# Patient Record
Sex: Male | Born: 1945 | Hispanic: Yes | Marital: Single
Health system: Southern US, Community
[De-identification: ages and names within clinical notes are randomized; demographics above are authoritative.]

---

## 2018-07-16 ENCOUNTER — Other Ambulatory Visit: Payer: Self-pay

## 2018-07-16 ENCOUNTER — Encounter: Payer: Self-pay | Admitting: Emergency Medicine

## 2018-07-16 ENCOUNTER — Emergency Department: Payer: Self-pay

## 2018-07-16 ENCOUNTER — Emergency Department
Admission: EM | Admit: 2018-07-16 | Discharge: 2018-07-17 | Disposition: A | Discharge status: Home / Self Care | Payer: Self-pay | Attending: Student in an Organized Health Care Education/Training Program | Admitting: Student in an Organized Health Care Education/Training Program

## 2018-07-16 DIAGNOSIS — J111 Influenza due to unidentified influenza virus with other respiratory manifestations: Secondary | ICD-10-CM | POA: Insufficient documentation

## 2018-07-16 DIAGNOSIS — K7689 Other specified diseases of liver: Secondary | ICD-10-CM | POA: Insufficient documentation

## 2018-07-16 LAB — CBC WITH DIFFERENTIAL/PLATELET
Abs Immature Granulocytes: 0.03 10*3/uL (ref 0.00–0.07)
Basophils Absolute: 0 10*3/uL (ref 0.0–0.1)
Basophils Relative: 0 %
Eosinophils Absolute: 0 10*3/uL (ref 0.0–0.5)
Eosinophils Relative: 0 %
HCT: 39 % (ref 39.0–52.0)
Hemoglobin: 12.6 g/dL — ABNORMAL LOW (ref 13.0–17.0)
Immature Granulocytes: 0 %
Lymphocytes Relative: 15 %
Lymphs Abs: 1.3 10*3/uL (ref 0.7–4.0)
MCH: 29.2 pg (ref 26.0–34.0)
MCHC: 32.3 g/dL (ref 30.0–36.0)
MCV: 90.5 fL (ref 80.0–100.0)
MONOS PCT: 11 %
Monocytes Absolute: 1 10*3/uL (ref 0.1–1.0)
Neutro Abs: 6.3 10*3/uL (ref 1.7–7.7)
Neutrophils Relative %: 74 %
Platelets: 165 10*3/uL (ref 150–400)
RBC: 4.31 MIL/uL (ref 4.22–5.81)
RDW: 12.5 % (ref 11.5–15.5)
WBC: 8.5 10*3/uL (ref 4.0–10.5)
nRBC: 0 % (ref 0.0–0.2)

## 2018-07-16 LAB — BASIC METABOLIC PANEL
Anion gap: 7 (ref 5–15)
BUN: 19 mg/dL (ref 8–23)
CO2: 25 mmol/L (ref 22–32)
Calcium: 8.4 mg/dL — ABNORMAL LOW (ref 8.9–10.3)
Chloride: 100 mmol/L (ref 98–111)
Creatinine, Ser: 0.8 mg/dL (ref 0.61–1.24)
GFR calc Af Amer: >60 mL/min (ref >60)
GFR calc non Af Amer: >60 mL/min (ref >60)
Glucose, Bld: 107 mg/dL — ABNORMAL HIGH (ref 70–99)
Potassium: 3.9 mmol/L (ref 3.5–5.1)
Sodium: 132 mmol/L — ABNORMAL LOW (ref 135–145)

## 2018-07-16 LAB — GROUP A STREP BY PCR: Group A Strep by PCR: NOT DETECTED

## 2018-07-16 LAB — INFLUENZA PANEL BY PCR (TYPE A & B)
INFLBPCR: POSITIVE — AB
Influenza A By PCR: NEGATIVE

## 2018-07-16 MED ORDER — METHYLPREDNISOLONE SODIUM SUCC 125 MG IJ SOLR
125.0000 mg | Freq: Once | INTRAMUSCULAR | Status: AC
  Administered 2018-07-17: 125 mg via INTRAVENOUS
  Filled 2018-07-16: qty 2

## 2018-07-16 MED ORDER — IOHEXOL 300 MG/ML  SOLN
100.0000 mL | Freq: Once | INTRAMUSCULAR | Status: AC | PRN
  Administered 2018-07-16: 100 mL via INTRAVENOUS
  Filled 2018-07-16: qty 100

## 2018-07-16 MED ORDER — IPRATROPIUM-ALBUTEROL 0.5-2.5 (3) MG/3ML IN SOLN
3.0000 mL | Freq: Once | RESPIRATORY_TRACT | Status: AC
  Administered 2018-07-16: 3 mL via RESPIRATORY_TRACT
  Filled 2018-07-16: qty 3

## 2018-07-16 MED ORDER — SODIUM CHLORIDE 0.9 % IV BOLUS
1000.0000 mL | Freq: Once | INTRAVENOUS | Status: AC
  Administered 2018-07-16: 1000 mL via INTRAVENOUS

## 2018-07-16 NOTE — ED Triage Notes (Signed)
Sore throat and body aches x 3 days.  

## 2018-07-16 NOTE — ED Provider Notes (Signed)
[facility] Emergency Department Provider Note ____________________________________________  Time seen: 1640  I have reviewed the triage vital signs and the nursing notes.  HISTORY  History limited by [place] language. Interpreter Markus Daft) present during interview and exam.  Chief Complaint  Sore Throat and Generalized Body Aches  HPI Sean Potts is a 73 y.o. male presents to the ED, accompanied by his friend. According to his friend, the patient lives in his [name], not far from her home. She and her family regularly check in on the patient, taking him meals & snacks, and inviting him into her home at times. She was concerned after she found his sleeping with complaints of weakness, headaches, sore throat, and bodyaches. He has admitted to decreased appetite, cough, and fatigue. He has denied any frank fevers, nausea, vomiting,chest or abdominal pain.He has been taking ibuprofen intermittently. He denies any pertinent medical history. He is a former smoker and drinks alcohol rarely.   History reviewed. No pertinent past medical history.  There are no active problems to display for this patient.  History reviewed. No pertinent surgical history.  Prior to Admission medications   Medication Sig Start Date End Date Taking? Authorizing Provider  predniSONE (DELTASONE) 10 MG tablet Take 1 tablet (10 mg total) by mouth 2 (two) times daily with a meal. 07/17/18   Genecis Veley, Charlesetta Ivory, PA-C    Allergies Patient has no known allergies.  No family history on file.  Social History Social History   Tobacco Use  . Smoking status: Not on file  Substance Use Topics  . Alcohol use: Not on file  . Drug use: Not on file    Review of Systems  Constitutional: Negative for fever. Reports weakness and general bodyaches.  Eyes: Negative for visual changes. ENT: Positive for sore throat. Cardiovascular: Negative for chest pain. Respiratory: Positive for  shortness of breath. Gastrointestinal: Negative for abdominal pain, vomiting and diarrhea. Genitourinary: Negative for dysuria. Musculoskeletal: Negative for back pain. Skin: Negative for rash. Neurological: Negative for headaches, focal weakness or numbness. ____________________________________________  PHYSICAL EXAM:  VITAL SIGNS: ED Triage Vitals  Enc Vitals Group     BP 07/16/18 1521 (!) 119/59     Pulse Rate 07/16/18 1521 87     Resp 07/16/18 1521 18     Temp 07/16/18 1521 98.1 F (36.7 C)     Temp Source 07/16/18 1521 Oral     SpO2 07/16/18 1521 95 %     Weight 07/16/18 1522 132 lb 4.4 oz (60 kg)     Height 07/16/18 1522 5\' 7"  (1.702 m)     Head Circumference --      Peak Flow --      Pain Score 07/16/18 1522 8     Pain Loc --      Pain Edu? --      Excl. in GC? --     Constitutional: Alert and oriented. Well appearing and in no distress. Head: Normocephalic and atraumatic. Eyes: Conjunctivae are normal. Normal extraocular movements Ears: Canals clear. TMs intact bilaterally. Nose: No congestion/rhinorrhea/epistaxis. Mouth/Throat: Mucous membranes are moist. Poor dentition. No oral lesions. Moderate gingivitis. Neck: Supple. No thyromegaly. Hematological/Lymphatic/Immunological: No cervical lymphadenopathy. Cardiovascular: Normal rate, regular rhythm. Normal distal pulses. Respiratory: Normal respiratory effort. No wheezes/rales. Mild rhonchi noted bilaterally. Gastrointestinal: Soft and nontender. No distention, rebound, guarding, organomegaly or rigidity. Normal bowel sounds noted. Musculoskeletal: Nontender with normal range of motion in all extremities.  Neurologic: Normal speech and language. No gross focal  neurologic deficits are appreciated. Skin:  Skin is warm, dry and intact. No rash noted. ____________________________________________   LABS (pertinent positives/negatives) Labs Reviewed  INFLUENZA PANEL BY PCR (TYPE A & B) - Abnormal; Notable for the  following components:      Result Value   Influenza B By PCR POSITIVE (*)    All other components within normal limits  BASIC METABOLIC PANEL - Abnormal; Notable for the following components:   Sodium 132 (*)    Glucose, Bld 107 (*)    Calcium 8.4 (*)    All other components within normal limits  CBC WITH DIFFERENTIAL/PLATELET - Abnormal; Notable for the following components:   Hemoglobin 12.6 (*)    All other components within normal limits  GROUP A STREP BY PCR  HEPATIC FUNCTION PANEL  ____________________________________________  EKG  NSR 91 bpm Normal axis Normal intervals No STEMI ____________________________________________   RADIOLOGY  CXR  IMPRESSION: No acute disease in the chest. Calcified mass in the liver. CT scan of the abdomen with contrast may be useful for further characterization of the liver mass. No prior studies available for Comparison.  CT ABD w/ CM  IMPRESSION: 4.0 x 3.5 cm rim calcified pericapsular lesion along the posterior right hepatic lobe, likely reflecting sequela of prior infection or trauma, most likely a prior pericapsular hematoma. Regardless, the appearance is almost certainly benign. ____________________________________________  PROCEDURES  Procedures NS 1000 ml IVP DuoNeb x 1 Solumedrol 125 mg IVP ____________________________________________  INITIAL IMPRESSION / ASSESSMENT AND PLAN / ED COURSE  Differential includes, but is not limited to, viral syndrome, bronchitis including COPD exacerbation, pneumonia, reactive airway disease including asthma, CHF including exacerbation with or without pulmonary/interstitial edema, pneumothorax, ACS, thoracic trauma, and pulmonary embolism.  Patient with ED evaluation of several days of fatigue, headache, cough and shortness of breath. He was found to have a PCR screen confirming influenza B. He was also found to have an incidental liver lesion on his otherwise negative CXR. That lesion  was found to be benign after a contrasted abdominal CT scan. His labs were otherwise benign and he was confirming improved appetite and energy at the time of this disposition. He will be discharged with a prescription for prednisone. He may take OTC cough medicine and antipyretics for additional symptom relief. He is beyond the treatment window for any anti-influenza preparations available. Return precautions have been reviewed upon his discharge. The patient and his friend verbalize understanding. ____________________________________________  FINAL CLINICAL IMPRESSION(S) / ED DIAGNOSES  Final diagnoses:  Influenza  Benign liver cyst      Karmen StabsMenshew, Charlesetta IvoryJenise V Bacon, PA-C 07/19/18 1215    Willy Eddyobinson, Patrick, MD 07/24/18 [phone]

## 2018-07-16 NOTE — ED Notes (Signed)
See triage note  Presents with body aches .cough and sore throat times 3 days

## 2018-07-16 NOTE — ED Notes (Signed)
See triage note  Presents with some body aches and sore throat  Afebrile on arrival

## 2018-07-17 LAB — HEPATIC FUNCTION PANEL
ALT: 24 U/L (ref 0–44)
AST: 37 U/L (ref 15–41)
Albumin: 3.5 g/dL (ref 3.5–5.0)
Alkaline Phosphatase: 87 U/L (ref 38–126)
Bilirubin, Direct: <0.1 mg/dL (ref 0.0–0.2)
Total Bilirubin: 0.5 mg/dL (ref 0.3–1.2)
Total Protein: 6.7 g/dL (ref 6.5–8.1)

## 2018-07-17 MED ORDER — PREDNISONE 10 MG PO TABS: 10.0000 mg | ORAL_TABLET | Freq: Two times a day (BID) | ORAL | 0 refills | Status: AC

## 2018-07-17 NOTE — Discharge Instructions (Addendum)
You have tested positive for influenza (flu). Take OTC cough medicine as needed. Take Tylenol and Motrin for fevers and bodyaches. Drink plenty of fluids and rest. Return to the Emergency Department as needed.   Ha dado positivo para la gripe .Marland Kitchenp. Tome medicamentos para la tos de venta libre segn sea necesario. Tome Tylenol y Motrin para fiebres y bodyaches. Beba abundantes lquidos y descanse. Regrese al [employer] segn sea necesario.

## 2020-04-21 IMAGING — CR DG CHEST 2V
1 series · 2 of 2 positions shown · non-contrast
Comparison: None.

CLINICAL DATA: Cough, sore throat, and body aches.

EXAM:
CHEST - 2 VIEW

[Series 1: dg chest 2 view · 0.14mm/px · 2 of 2 slices shown]
[im 1/2]
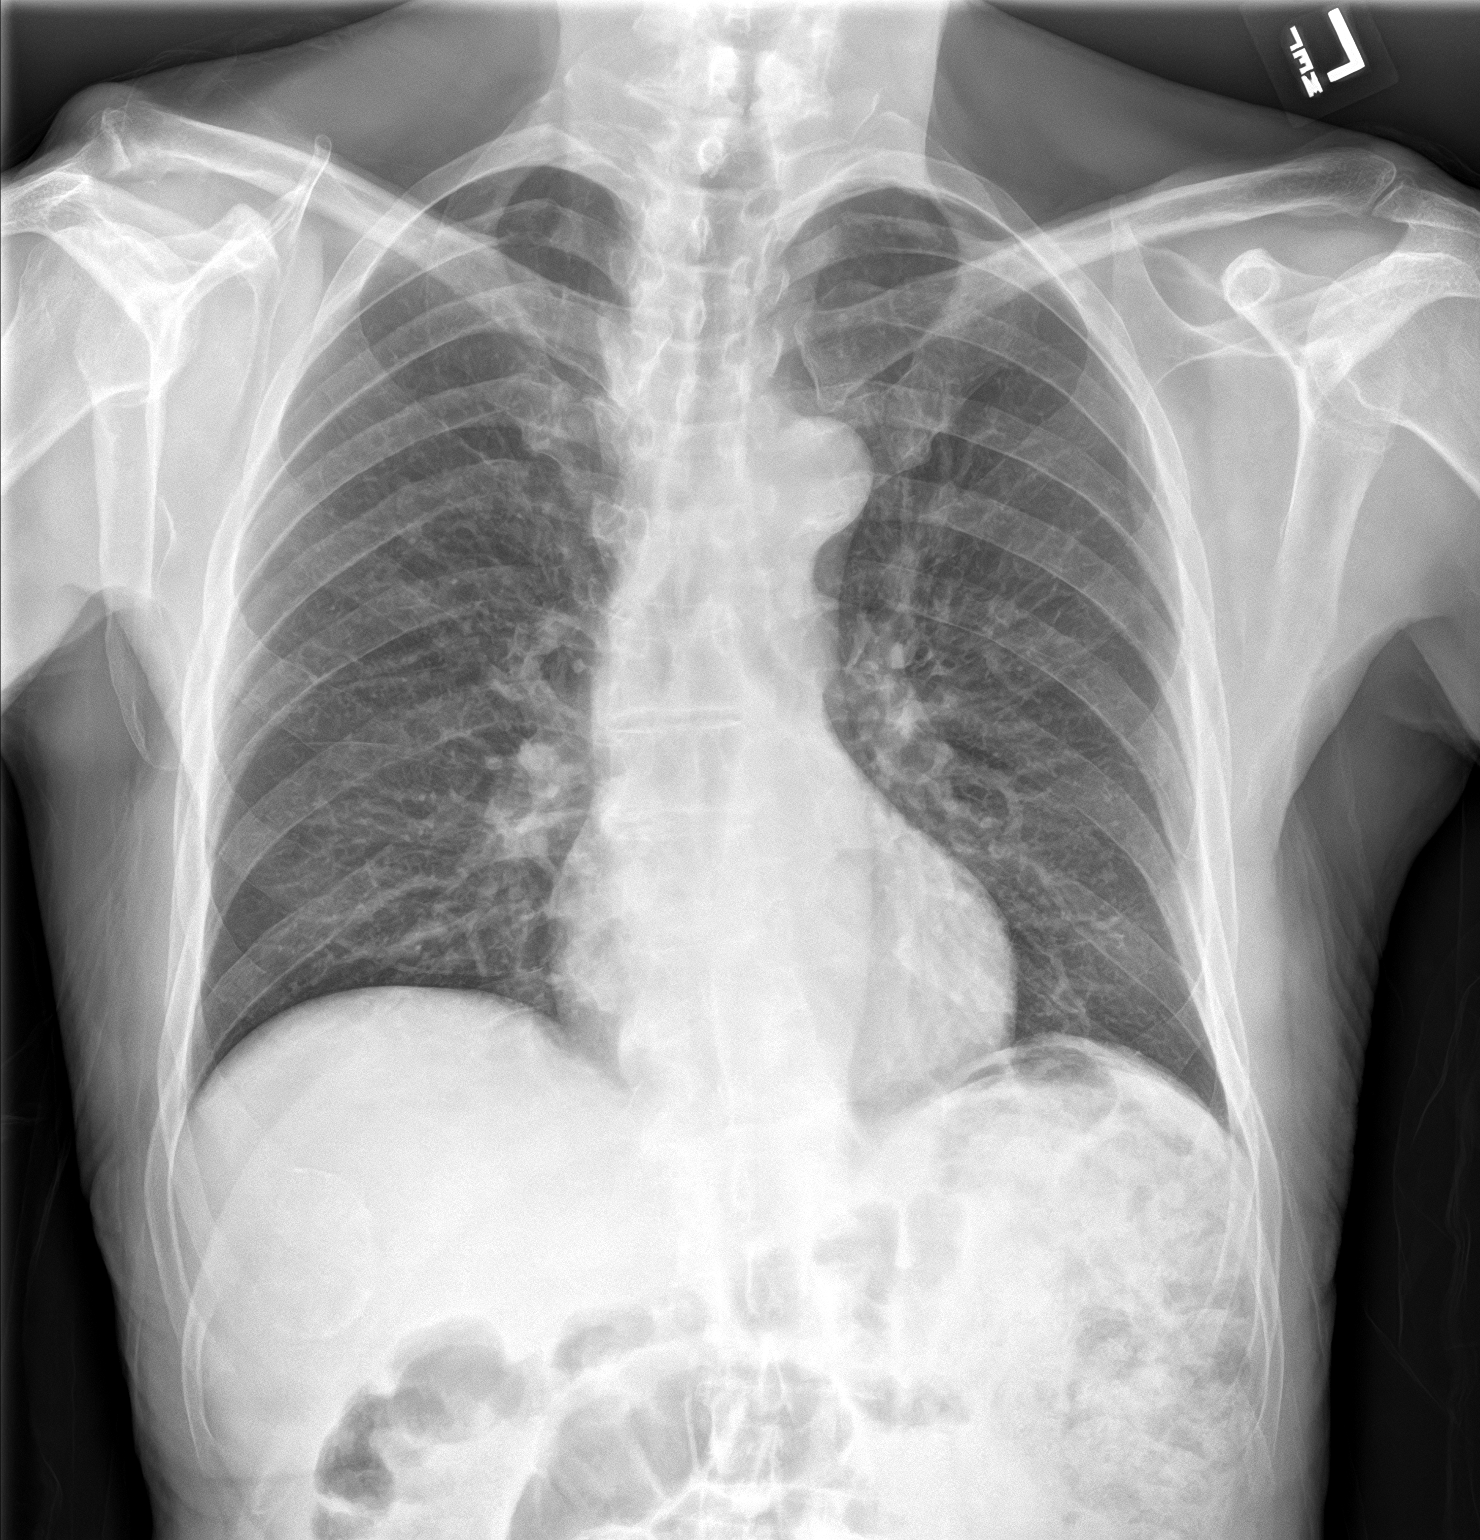
[im 2/2]
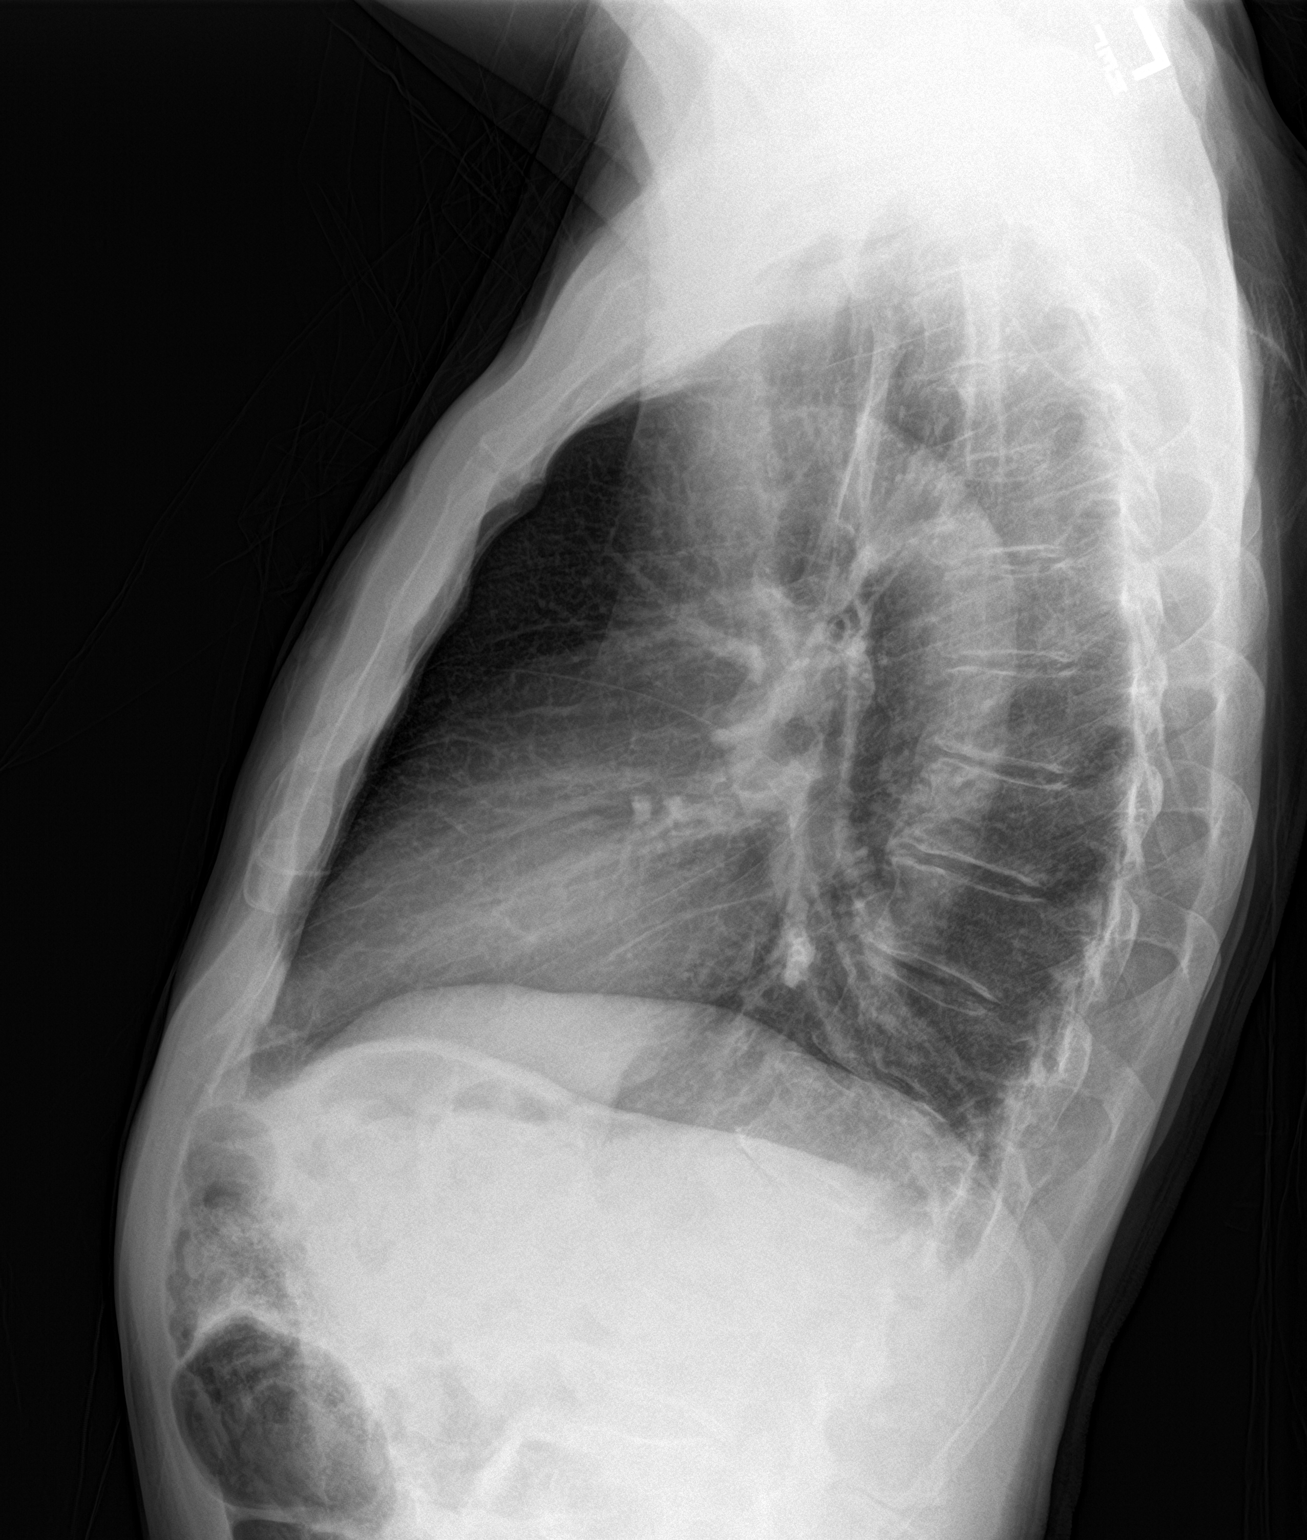

[2 of 2 positions shown; findings below may reference images not displayed]

FINDINGS: The heart size pulmonary vascularity are normal in the lungs are
clear. No effusions. Slight thoracolumbar scoliosis.

Incidental note is made of a 4.7 cm partially calcified mass in the
right upper quadrant, probably in the liver.
IMPRESSION: No acute disease in the chest. Calcified mass in the liver. CT scan
of the abdomen with contrast may be useful for further
characterization of the liver mass. No prior studies available for
comparison.

## 2020-04-21 IMAGING — CT CT ABDOMEN W/ CM
2 of 5 series · 15 of 46 positions shown, 17 images · IV contrast (APPLIED)
Comparison: Chest radiographs dated 07/16/2018

CLINICAL DATA: Sore throat, body aches x3 days, incidental
calcified liver lesion on chest radiograph

EXAM:
CT ABDOMEN WITH CONTRAST
TECHNIQUE: Multidetector CT imaging of the abdomen was performed using the
standard protocol following bolus administration of intravenous
contrast.
CONTRAST:  100mL OMNIPAQUE IOHEXOL 300 MG/ML  SOLN

[Series 2: routine abd/pel with · axial · 0.65mm/px · z∈[-218,+2]mm · 12 of 54 slices shown, 14 images]
[im 5/54  soft-tissue]
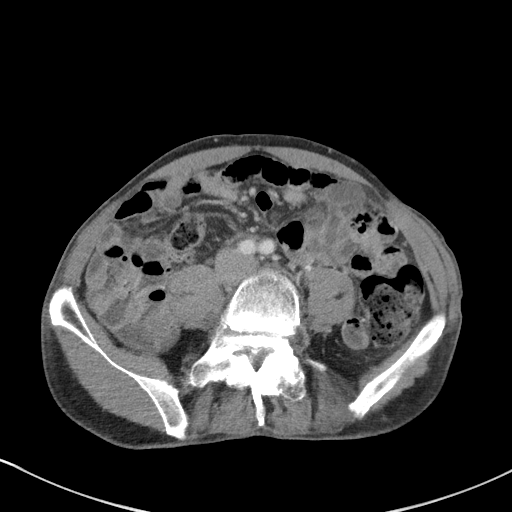
[im 5/54  bone]
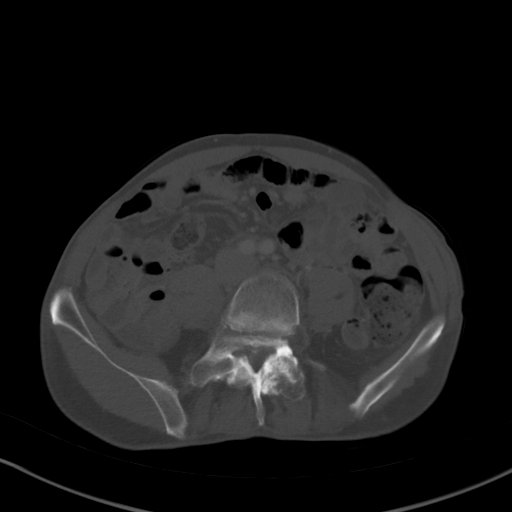
[im 9/54  soft-tissue]
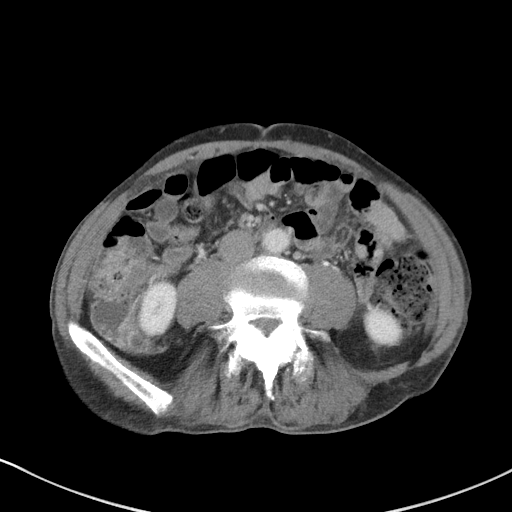
[im 13/54  soft-tissue]
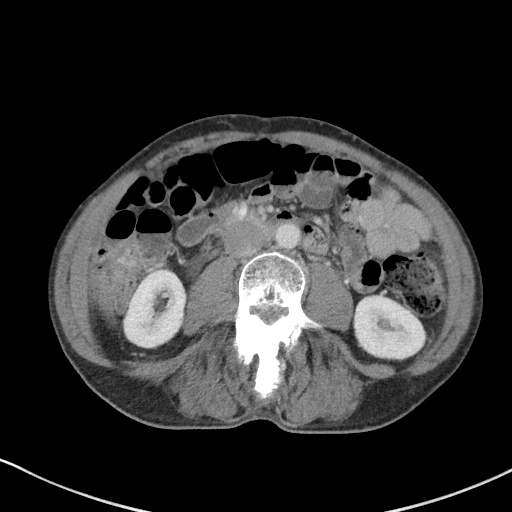
[im 17/54  soft-tissue]
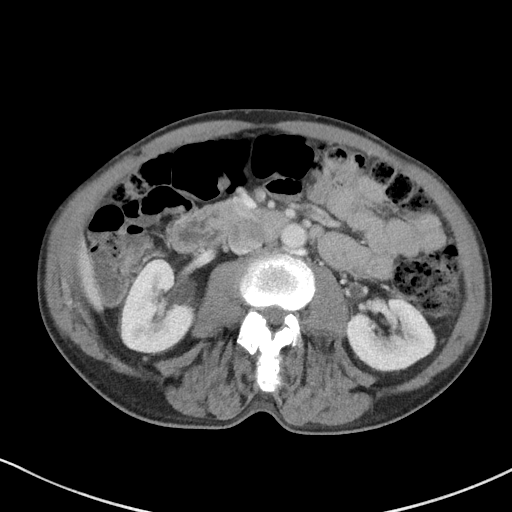
[im 21/54  soft-tissue]
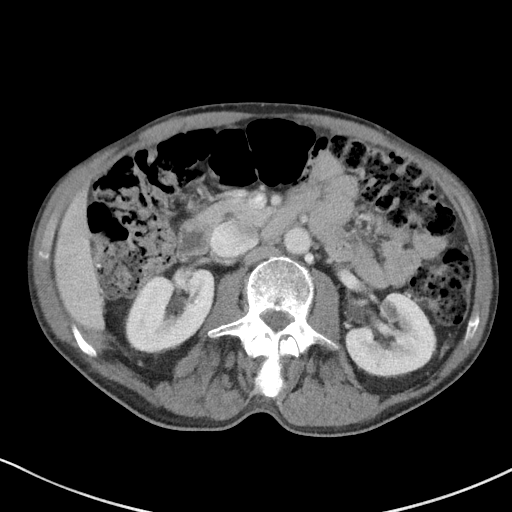
[im 25/54  soft-tissue]
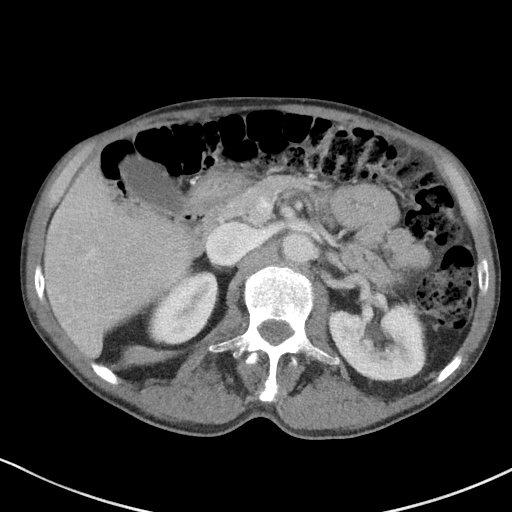
[im 29/54  soft-tissue]
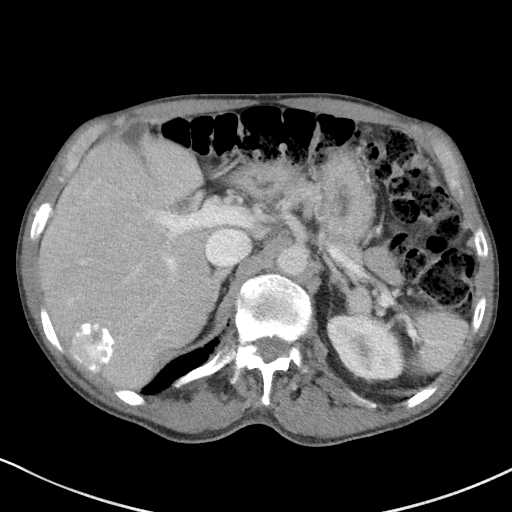
[im 33/54  soft-tissue]
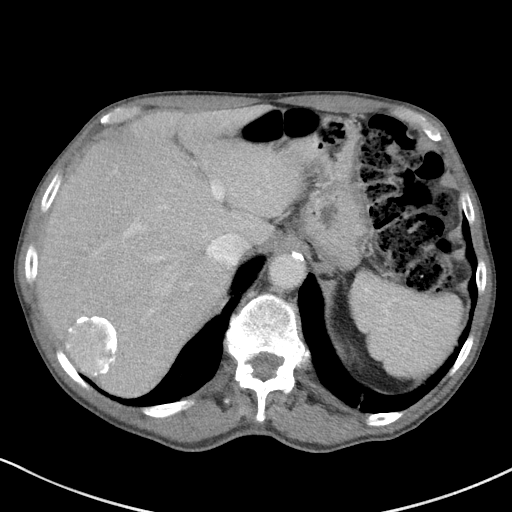
[im 37/54  soft-tissue]
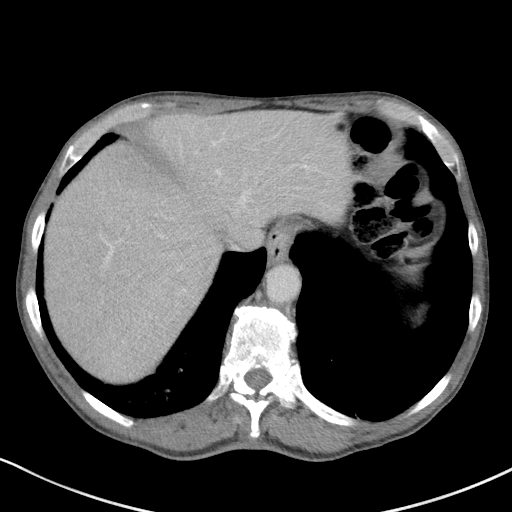
[im 37/54  bone]
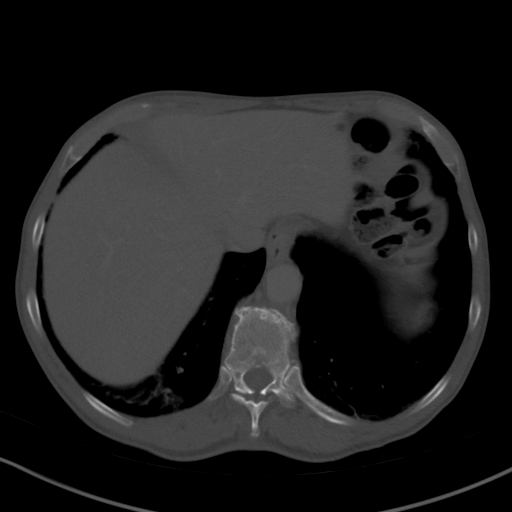
[im 41/54  soft-tissue]
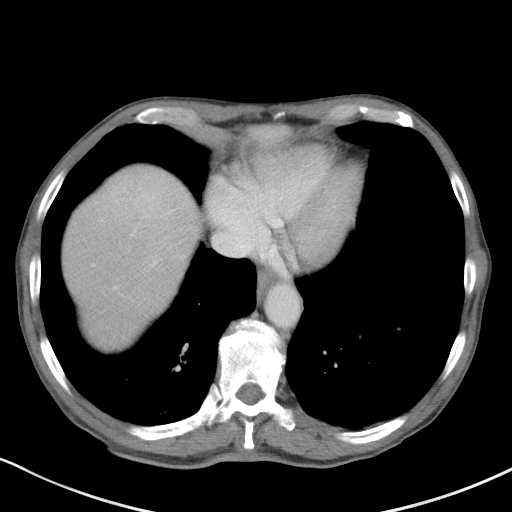
[im 45/54  soft-tissue]
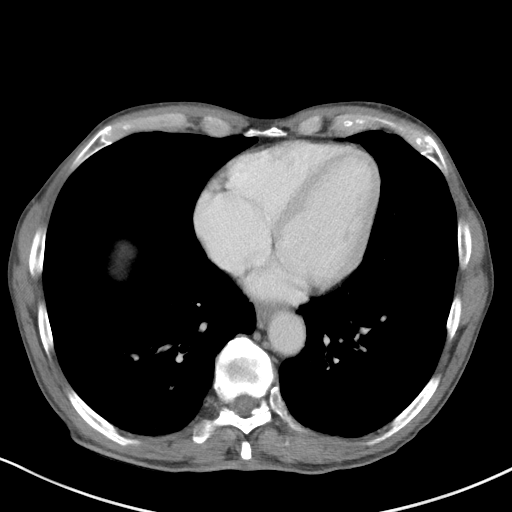
[im 49/54  soft-tissue]
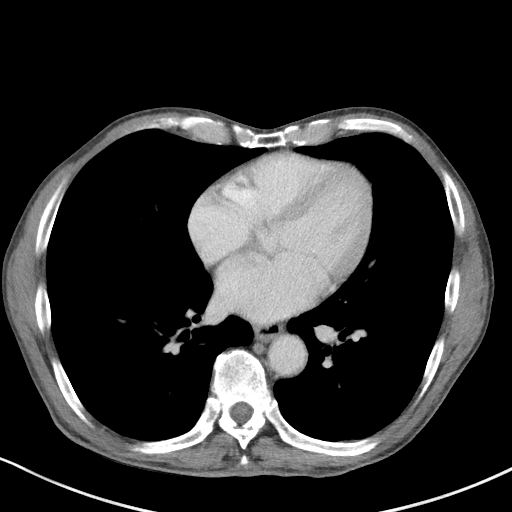

[Series 5: coronal st · coronal · 0.54mm/px · 3 of 82 slices shown]
[im 28/82  soft-tissue]
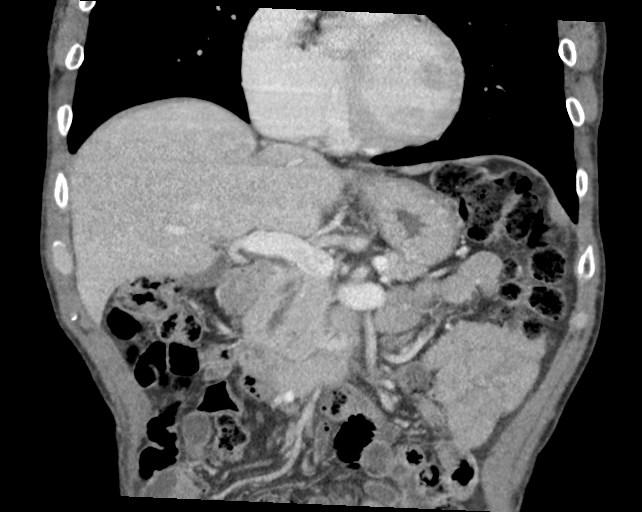
[im 37/82  soft-tissue]
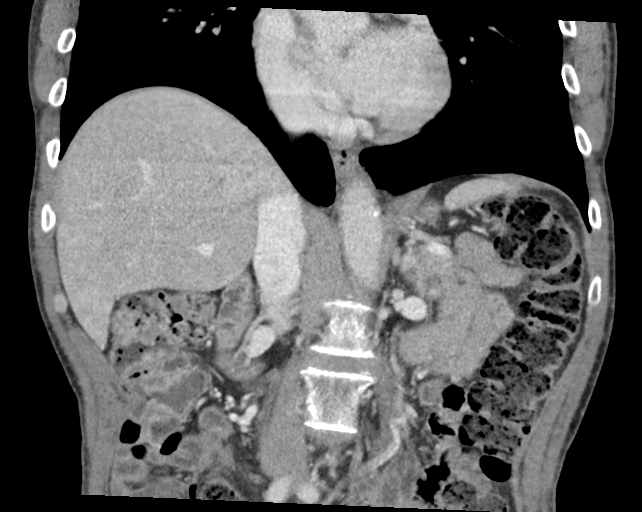
[im 46/82  soft-tissue]
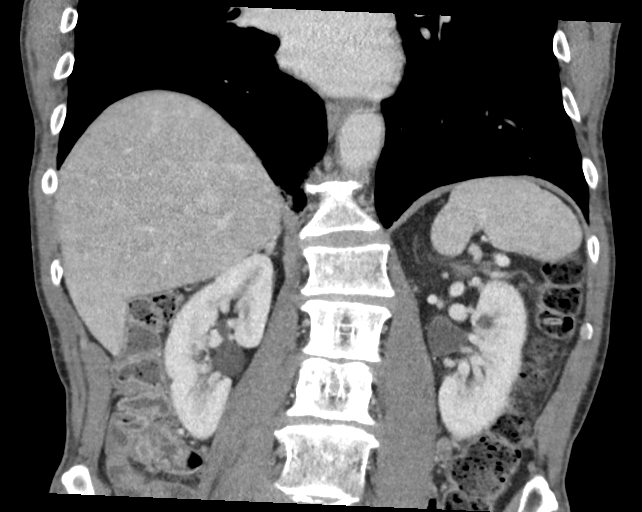

[15 of 46 positions shown; findings below may reference images not displayed]

FINDINGS: Lower chest: Minimal dependent atelectasis in the bilateral lower
lobes.

Hepatobiliary: Benign appearing 4.0 x 3.5 cm rim calcified lesion in
the posterior right hepatic lobe. Lesion appears to be pericapsular.
While strictly speaking enhancement is difficult to exclude in the
absence of enhanced CT, the overall appearance favors a calcified
pericapsular lesion related to prior infection or trauma, most
likely a prior pericapsular hematoma.

Gallbladder is unremarkable. No intrahepatic or extrahepatic ductal
dilatation.

Pancreas: Within normal limits.

Spleen: Within normal limits.

Adrenals/Urinary Tract: Adrenal glands are within normal limits.

12 mm cyst in the anterior interpolar left kidney (series 7/image
16). Right kidney is within normal limits. No hydronephrosis.

Stomach/Bowel: Stomach is within normal limits.

Visualized bowel is unremarkable, including a normal appendix
(series 2/image 49).

Vascular/Lymphatic: No evidence of abdominal aortic aneurysm.

Circumaortic left renal vein.

No suspicious abdominal lymphadenopathy.

Other: No abdominal ascites.

Musculoskeletal: Very mild degenerative changes of the lower
thoracic spine.
IMPRESSION: 4.0 x 3.5 cm rim calcified pericapsular lesion along the posterior
right hepatic lobe, likely reflecting sequela of prior infection or
trauma, most likely a prior pericapsular hematoma. Regardless, the
appearance is almost certainly benign.
